# Patient Record
Sex: Male | Born: 2000 | Race: Black or African American | Hispanic: No | Marital: Single | Smoking: Never smoker
Health system: Southern US, Community
[De-identification: ages and names within clinical notes are randomized; demographics above are authoritative.]

---
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
## 2021-05-22 ENCOUNTER — Ambulatory Visit: Payer: 59 | Admitting: Dermatology

## 2021-05-22 DIAGNOSIS — L730 Acne keloid: Secondary | ICD-10-CM | POA: Diagnosis not present

## 2021-05-22 MED ORDER — CLINDAMYCIN PHOSPHATE 1 % EX SOLN: CUTANEOUS | 6 refills | Status: AC

## 2021-05-22 MED ORDER — CLOBETASOL PROPIONATE 0.05 % EX OINT: TOPICAL_OINTMENT | CUTANEOUS | 3 refills | Status: AC

## 2021-05-22 NOTE — Patient Instructions (Signed)

## 2021-05-22 NOTE — Progress Notes (Signed)
? ?  New Patient Visit ? ?Subjective  ?Angel Preston is a 21 y.o. male who presents for the following: Lesions (On the post scalp - have been present since high school, not itchy or painful, patient would like them checked today to make sure they are not harmful.). ? ?The following portions of the chart were reviewed this encounter and updated as appropriate:  ? Allergies  Meds  Problems  Med Hx  Surg Hx  Fam Hx   ?  ?Review of Systems:  No other skin or systemic complaints except as noted in HPI or Assessment and Plan. ? ?Objective  ?Well appearing patient in no apparent distress; mood and affect are within normal limits. ? ?A focused examination was performed including the scalp. Relevant physical exam findings are noted in the Assessment and Plan. ? ?Post scalp ? ? ? ? ? 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 Condition is symptomatic / bothersome to patient. Not to goal.  ? ?Start Clindamycin solution apply to aa's QHS.  ?Start Clobetasol ointment QHS 5d/wk.  ? ?Topical steroids (such as triamcinolone, fluocinolone, fluocinonide, mometasone, clobetasol, halobetasol, betamethasone, hydrocortisone) can cause thinning and lightening of the skin if they are used for too long in the same area. Your physician has selected the right strength medicine for your problem and area affected on the body. Please use your medication only as directed by your physician to prevent side effects.  ? ?clindamycin (CLEOCIN T) 1 % external solution - Post scalp ?Apply to aa's scalp QHS. ?clobetasol ointment (TEMOVATE) 0.05 % - Post scalp ?Apply to aa's scalp QHS 5d/wk. Avoid applying to face, groin, and axilla. Use as directed. Long-term use can cause thinning of the skin. ? ?Return in about 4 months (around 09/21/2021) for [facility] follow up . ? ?ICari Caraway, CMA, am acting as scribe for Armida Sans, MD . ?Documentation: I have  reviewed the above documentation for accuracy and completeness, and I agree with the above. ? ?Armida Sans, MD ? ? ?

## 2021-05-30 ENCOUNTER — Encounter: Payer: Self-pay | Admitting: Dermatology

## 2021-09-21 ENCOUNTER — Ambulatory Visit: Payer: 59 | Admitting: Dermatology

## 2021-12-15 ENCOUNTER — Encounter: Payer: Self-pay | Admitting: Internal Medicine

## 2021-12-15 ENCOUNTER — Ambulatory Visit: Payer: 59 | Admitting: Internal Medicine

## 2021-12-15 VITALS — BP 130/74 | HR 100 | Temp 98.4°F | Resp 18 | Ht 70.0 in | Wt 260.2 lb

## 2021-12-15 DIAGNOSIS — Z1159 Encounter for screening for other viral diseases: Secondary | ICD-10-CM

## 2021-12-15 DIAGNOSIS — Z130 Encounter for screening for diseases of the blood and blood-forming organs and certain disorders involving the immune mechanism: Secondary | ICD-10-CM | POA: Diagnosis not present

## 2021-12-15 DIAGNOSIS — Z114 Encounter for screening for human immunodeficiency virus [HIV]: Secondary | ICD-10-CM

## 2021-12-15 DIAGNOSIS — R7309 Other abnormal glucose: Secondary | ICD-10-CM

## 2021-12-15 DIAGNOSIS — Z0000 Encounter for general adult medical examination without abnormal findings: Secondary | ICD-10-CM | POA: Diagnosis not present

## 2021-12-15 DIAGNOSIS — Z872 Personal history of diseases of the skin and subcutaneous tissue: Secondary | ICD-10-CM

## 2021-12-15 DIAGNOSIS — Z13228 Encounter for screening for other metabolic disorders: Secondary | ICD-10-CM | POA: Diagnosis not present

## 2021-12-15 NOTE — Patient Instructions (Signed)
It was great seeing you today!  Plan discussed at today's visit: -Blood work ordered today, results will be uploaded to [facility].   Human Papillomavirus (HPV) Vaccine Injection What is this medication? HUMAN PAPILLOMAVIRUS VACCINE (HYOO muhn pap uh LOH muh vahy ruhs vak SEEN) reduces the risk of human papillomavirus (HPV). It does not treat HPV. It is still possible to get HPV after receiving this vaccine, but the symptoms may be less severe or not last as long. It works by helping your immune system learn how to fight off a future infection. This medicine may be used for other purposes; ask your health care provider or pharmacist if you have questions. COMMON BRAND NAME(S): Gardasil 9 What should I tell my care team before I take this medication? They need to know if you have any of these conditions: Fever Hemophilia HIV or AIDS Immune system problems Infection Low platelets An unusual reaction to human papillomavirus vaccine, yeast, other vaccines, other medications, foods, dyes, or preservatives Pregnant or trying to get pregnant Breastfeeding How should I use this medication? This vaccine is injected into a muscle. It is given by your care team. This vaccine requires 2 or 3 doses to get the full benefit. Set a reminder for when your next dose is due. A copy of the Vaccine Information Statement will be given before each vaccination. Be sure to read this information carefully each time. This sheet may change often. Talk to your care team about the use of this medication in children. While it may be prescribed for children as young as 9 years for selected conditions, precautions do apply. Overdosage: If you think you have taken too much of this medicine contact a poison control center or emergency room at once. NOTE: This medicine is only for you. Do not share this medicine with others. What if I miss a dose? Keep appointments for follow-up doses as directed. It is important not to miss  your dose. Call your care team if you are unable to keep an appointment. What may interact with this medication? Certain medications for arthritis Medications for organ transplant Medications to treat cancer Steroid medications, such as prednisone or cortisone This list may not describe all possible interactions. Give your health care provider a list of all the medicines, herbs, non-prescription drugs, or dietary supplements you use. Also tell them if you smoke, drink alcohol, or use illegal drugs. Some items may interact with your medicine. What should I watch for while using this medication? Visit your care team regularly. Report any side effects to your care team right away. This vaccine, like all vaccines, may not fully protect everyone. What side effects may I notice from receiving this medication? Side effects that you should report to your care team as soon as possible: Allergic reactions--skin rash, itching, hives, swelling of the face, lips, tongue, or throat Feeling faint or lightheaded Side effects that usually do not require medical attention (report these to your care team if they continue or are bothersome): Diarrhea Dizziness Fatigue Fever Headache Nausea Pain, redness, irritation, or bruising at the injection site This list may not describe all possible side effects. Call your doctor for medical advice about side effects. You may report side effects to FDA at 1-800-FDA-1088. Where should I keep my medication? This vaccine is only given by your care team. It will not be stored at home. NOTE: This sheet is a summary. It may not cover all possible information. If you have questions about this medicine, talk to  your doctor, pharmacist, or health care provider.  2023 Elsevier/Gold Standard (2021-06-27 00:00:00)  Follow up in: 1 year   Take care and let us know if you have any questions or concerns prior to your next visit.  Dr. Rosana Berger

## 2021-12-15 NOTE — Progress Notes (Signed)
New Patient Office Visit  Subjective    Patient ID: Angel Preston, male    DOB: 02-07-2000  Age: 21 y.o. MRN: [id number]  CC:  Chief Complaint  Patient presents with   Establish Care   Nail Problem    Infection back in sept, nail fell off, growing back weird    HPI Angel Preston presents to establish care.  He with his father.  The patient has no chronic medical conditions and does not take any medications daily.  He has followed with dermatology and is currently being treated with clindamycin cream ankle betazole ointment as needed for acne.  The patient was recently seen for paronychia of the fourth digit on his left hand on 10/29/2021.  He was treated with doxycycline and the nail was removed.  The patient states the nail has been slowly growing back but he is worried it is not growing back normally.  He denies any redness, pain or drainage associated with the nail or finger.  Otherwise he is doing well and has no other questions or concerns today.  Health maintenance: -Blood work due -Flu vaccine and HPV vaccine series due, discussed with patient and his father who politely declines today.  Literature provided.      Outpatient Encounter Medications as of 12/15/2021  Medication Sig   clindamycin (CLEOCIN T) 1 % external solution Apply to aa's scalp QHS.   clobetasol ointment (TEMOVATE) 0.05 % Apply to aa's scalp QHS 5d/wk. Avoid applying to face, groin, and axilla. Use as directed. Long-term use can cause thinning of the skin.   No facility-administered encounter medications on file as of 12/15/2021.    History reviewed. No pertinent past medical history.  History reviewed. No pertinent surgical history.  Family History  Problem Relation Age of Onset   Hypertension Mother    Diabetes Mother    Hypertension Father     Social History   Socioeconomic History   Marital status: Single    Spouse name: Not on file   Number of children: Not on file   Years of education: Not on  file   Highest education level: Not on file  Occupational History   Not on file  Tobacco Use   Smoking status: Never   Smokeless tobacco: Never  Vaping Use   Vaping Use: Never used  Substance and Sexual Activity   Alcohol use: Never   Drug use: Not Currently   Sexual activity: Never  Other Topics Concern   Not on file  Social History Narrative   Not on file   Social Determinants of Health   Financial Resource Strain: Not on file  Food Insecurity: Not on file  Transportation Needs: Not on file  Physical Activity: Not on file  Stress: Not on file  Social Connections: Not on file  Intimate Partner Violence: Not on file    Review of Systems  Constitutional:  Negative for chills and fever.  Gastrointestinal:  Negative for abdominal pain, diarrhea, nausea and vomiting.  Genitourinary:  Negative for dysuria and frequency.      Objective    BP 130/74   Pulse 100   Temp 98.4 F (36.9 C)   Resp 18   Ht 5\' 10"  (1.778 m)   Wt 260 lb 3.2 oz (118 kg)   SpO2 98%   BMI 37.33 kg/m   Physical Exam Constitutional:      Appearance: Normal appearance.  HENT:     Head: Normocephalic and atraumatic.     Mouth/Throat:  Mouth: Mucous membranes are moist.     Pharynx: Oropharynx is clear.  Eyes:     Extraocular Movements: Extraocular movements intact.     Conjunctiva/sclera: Conjunctivae normal.     Pupils: Pupils are equal, round, and reactive to light.  Cardiovascular:     Rate and Rhythm: Normal rate and regular rhythm.  Pulmonary:     Effort: Pulmonary effort is normal.     Breath sounds: Normal breath sounds.  Musculoskeletal:     Right lower leg: No edema.     Left lower leg: No edema.  Lymphadenopathy:     Cervical: No cervical adenopathy.  Skin:    General: Skin is warm and dry.     Comments: Fourth left digit nail slowly growing back with slight curved appearance, surrounding skin without signs of infection.  Neurological:     General: No focal deficit  present.     Mental Status: He is alert. Mental status is at baseline.  Psychiatric:        Mood and Affect: Mood normal.        Behavior: Behavior normal.         Assessment & Plan:   1. Annual physical exam/ Screening for metabolic disorder/Screening for deficiency anemia/Encounter for hepatitis C screening test for low risk patient/Screening for HIV without presence of risk factors: Screening labs due.  Follow-up in 1 year or sooner as needed.  - HIV antibody (with reflex) - Hepatitis C Antibody - CBC w/Diff/Platelet - COMPLETE METABOLIC PANEL WITH GFR  2. History of paronychia of finger: No current infection, nail slowly growing back.  Return in about 1 year (around 12/16/2022).   Margarita Mail, DO

## 2021-12-18 NOTE — Addendum Note (Signed)
Addended by: Margarita Mail on: 12/18/2021 09:37 PM   Modules accepted: Orders

## 2021-12-20 LAB — COMPLETE METABOLIC PANEL WITH GFR
AG Ratio: 2 (calc) (ref 1.0–2.5)
ALT: 44 U/L (ref 9–46)
AST: 20 U/L (ref 10–40)
Albumin: 4.6 g/dL (ref 3.6–5.1)
Alkaline phosphatase (APISO): 61 U/L (ref 36–130)
BUN: 8 mg/dL (ref 7–25)
CO2: 30 mmol/L (ref 20–32)
Calcium: 9.9 mg/dL (ref 8.6–10.3)
Chloride: 102 mmol/L (ref 98–110)
Creat: 0.97 mg/dL (ref 0.60–1.24)
Globulin: 2.3 g/dL (calc) (ref 1.9–3.7)
Glucose, Bld: 136 mg/dL — ABNORMAL HIGH (ref 65–99)
Potassium: 4.3 mmol/L (ref 3.5–5.3)
Sodium: 140 mmol/L (ref 135–146)
Total Bilirubin: 0.7 mg/dL (ref 0.2–1.2)
Total Protein: 6.9 g/dL (ref 6.1–8.1)
eGFR: 115 mL/min/{1.73_m2} (ref >=60)

## 2021-12-20 LAB — CBC WITH DIFFERENTIAL/PLATELET
Absolute Monocytes: 578 cells/uL (ref 200–950)
Basophils Absolute: 49 cells/uL (ref 0–200)
Basophils Relative: 0.5 %
Eosinophils Absolute: 98 cells/uL (ref 15–500)
Eosinophils Relative: 1 %
HCT: 46.2 % (ref 38.5–50.0)
Hemoglobin: 14.8 g/dL (ref 13.2–17.1)
Lymphs Abs: 2421 cells/uL (ref 850–3900)
MCH: 27.1 pg (ref 27.0–33.0)
MCHC: 32 g/dL (ref 32.0–36.0)
MCV: 84.5 fL (ref 80.0–100.0)
MPV: 11 fL (ref 7.5–12.5)
Monocytes Relative: 5.9 %
Neutro Abs: 6654 cells/uL (ref 1500–7800)
Neutrophils Relative %: 67.9 %
Platelets: 304 10*3/uL (ref 140–400)
RBC: 5.47 10*6/uL (ref 4.20–5.80)
RDW: 12.3 % (ref 11.0–15.0)
Total Lymphocyte: 24.7 %
WBC: 9.8 10*3/uL (ref 3.8–10.8)

## 2021-12-20 LAB — TEST AUTHORIZATION

## 2021-12-20 LAB — HEMOGLOBIN A1C
Hgb A1c MFr Bld: 5.8 % of total Hgb — ABNORMAL HIGH (ref <5.7)
Mean Plasma Glucose: 120 mg/dL
eAG (mmol/L): 6.6 mmol/L

## 2021-12-20 LAB — HIV ANTIBODY (ROUTINE TESTING W REFLEX): HIV 1&2 Ab, 4th Generation: NONREACTIVE

## 2021-12-20 LAB — HEPATITIS C ANTIBODY: Hepatitis C Ab: NONREACTIVE

## 2022-10-20 LAB — HEMOGLOBIN A1C: A1c: 5.4

## 2022-12-17 ENCOUNTER — Ambulatory Visit (INDEPENDENT_AMBULATORY_CARE_PROVIDER_SITE_OTHER): Payer: 59 | Admitting: Internal Medicine

## 2022-12-17 ENCOUNTER — Encounter: Payer: Self-pay | Admitting: Internal Medicine

## 2022-12-17 VITALS — BP 134/86 | HR 70 | Temp 98.0°F | Resp 18 | Ht 70.0 in | Wt 258.0 lb

## 2022-12-17 DIAGNOSIS — Z23 Encounter for immunization: Secondary | ICD-10-CM | POA: Diagnosis not present

## 2022-12-17 DIAGNOSIS — Z0000 Encounter for general adult medical examination without abnormal findings: Secondary | ICD-10-CM

## 2022-12-17 NOTE — Addendum Note (Signed)
Addended by: Davene Costain on: 12/17/2022 09:57 AM   Modules accepted: Orders

## 2022-12-17 NOTE — Patient Instructions (Signed)
It was great seeing you today!  Plan discussed at today's visit: -Blood work ordered today, results will be uploaded to MyChart.  -Flu vaccine and first HPV vaccine given today, will have you return in 1 month for second HPV vaccine and Tdap vaccine  Follow up in: 1 month nurse's visit and 1 year for CPE  Take care and let us know if you have any questions or concerns prior to your next visit.  Dr. Caralee Ates

## 2022-12-17 NOTE — Progress Notes (Signed)
Name: Angel Preston   MRN: [id number]    DOB: 06-Mar-2000   Date:12/17/2022       Progress Note  Subjective  Chief Complaint  Chief Complaint  Patient presents with   Annual Exam    HPI  Patient presents for annual CPE. He is here with his dad today.  Diet: chicken, rice, seafood. Well rounded, doesn't like crab and pork. Eats fruits and vegetables.  Exercise: None, discussed importance of increasing cardiovascular exercise to 150 minutes a week Last Dental Exam: UTD Last Eye Exam: Doesn't have an eye doctor but no issues  Depression: phq 9 is negative    12/17/2022    8:58 AM 12/15/2021   10:32 AM  Depression screen PHQ 2/9  Decreased Interest 0 0  Down, Depressed, Hopeless 0 0  PHQ - 2 Score 0 0  Altered sleeping 0 0  Tired, decreased energy 0 0  Change in appetite 0 0  Feeling bad or failure about yourself  0 0  Trouble concentrating 0 0  Moving slowly or fidgety/restless 0 0  Suicidal thoughts 0 0  PHQ-9 Score 0 0  Difficult doing work/chores Not difficult at all Not difficult at all    Hypertension:  BP Readings from Last 3 Encounters:  12/17/22 134/86  12/15/21 130/74    Obesity: Wt Readings from Last 3 Encounters:  12/17/22 258 lb (117 kg)  12/15/21 260 lb 3.2 oz (118 kg)   BMI Readings from Last 3 Encounters:  12/17/22 37.02 kg/m  12/15/21 37.33 kg/m     Lipids:  No results found for: "CHOL" No results found for: "HDL" No results found for: "LDLCALC" No results found for: "TRIG" No results found for: "CHOLHDL" No results found for: "LDLDIRECT" Glucose:  Glucose, Bld  Date Value Ref Range Status  12/15/2021 136 (H) 65 - 99 mg/dL Final    Comment:    .            Fasting reference interval . For someone without known diabetes, a glucose value >125 mg/dL indicates that they may have diabetes and this should be confirmed with a follow-up test. .       Single STD testing and prevention (HIV/chl/gon/syphilis): HIV complete - no  concerns about STD's Hep C Screening: complete Skin cancer: Discussed monitoring for atypical lesions Colorectal cancer: N/a Prostate cancer:  not applicable No results found for: "PSA"   Lung cancer:  Low Dose CT Chest recommended if Age 21-80 years, 30 pack-year currently smoking OR have quit w/in 21years. Patient  not applicable a candidate for screening   AAA: The USPSTF recommends one-time screening with ultrasonography in men ages 21 to 75 years who have ever smoked. Patient   not applicable, a candidate for screening   Vaccines:   HPV: due Tdap: due Shingrix: N/a Pneumonia: N/a Flu: due COVID-19:3 doses  There are no problems to display for this patient.   History reviewed. No pertinent surgical history.  Family History  Problem Relation Age of Onset   Hypertension Mother    Diabetes Mother    Hypertension Father     Social History   Socioeconomic History   Marital status: Single    Spouse name: Not on file   Number of children: Not on file   Years of education: Not on file   Highest education level: Not on file  Occupational History   Not on file  Tobacco Use   Smoking status: Never   Smokeless tobacco: Never  Vaping Use  Vaping status: Never Used  Substance and Sexual Activity   Alcohol use: Never   Drug use: Not Currently   Sexual activity: Never  Other Topics Concern   Not on file  Social History Narrative   Not on file   Social Determinants of Health   Financial Resource Strain: Not on file  Food Insecurity: Not on file  Transportation Needs: Not on file  Physical Activity: Not on file  Stress: Not on file  Social Connections: Not on file  Intimate Partner Violence: Not on file     Current Outpatient Medications:    clindamycin (CLEOCIN T) 1 % external solution, Apply to aa's scalp QHS., Disp: 30 mL, Rfl: 6   clobetasol ointment (TEMOVATE) 0.05 %, Apply to aa's scalp QHS 5d/wk. Avoid applying to face, groin, and axilla. Use as  directed. Long-term use can cause thinning of the skin., Disp: 30 g, Rfl: 3  No Known Allergies   Review of Systems  All other systems reviewed and are negative.    Objective  Vitals:   12/17/22 0856  BP: 134/86  Pulse: 70  Resp: 18  Temp: 98 F (36.7 C)  SpO2: 98%  Weight: 258 lb (117 kg)  Height: 5\' 10"  (1.778 m)    Body mass index is 37.02 kg/m.  Physical Exam Constitutional:      Appearance: Normal appearance.  HENT:     Head: Normocephalic and atraumatic.     Mouth/Throat:     Mouth: Mucous membranes are moist.     Pharynx: Oropharynx is clear.  Eyes:     Extraocular Movements: Extraocular movements intact.     Conjunctiva/sclera: Conjunctivae normal.     Pupils: Pupils are equal, round, and reactive to light.  Neck:     Comments: No thyromegaly  Cardiovascular:     Rate and Rhythm: Normal rate and regular rhythm.  Pulmonary:     Effort: Pulmonary effort is normal.     Breath sounds: Normal breath sounds.  Musculoskeletal:     Cervical back: No tenderness.     Right lower leg: No edema.     Left lower leg: No edema.  Lymphadenopathy:     Cervical: No cervical adenopathy.  Skin:    General: Skin is warm and dry.  Neurological:     General: No focal deficit present.     Mental Status: He is alert. Mental status is at baseline.  Psychiatric:        Mood and Affect: Mood normal.        Behavior: Behavior normal.     Recent Results (from the past 2160 hour(s))  Hemoglobin A1c     Status: None   Collection Time: 10/20/22  9:07 AM  Result Value Ref Range   A1c 5.4     Comment: Abstracted by HIM     Fall Risk:    12/17/2022    8:56 AM 12/15/2021   10:32 AM  Fall Risk   Falls in the past year? 0 0  Number falls in past yr: 0 0  Injury with Fall? 0 0     Functional Status Survey: Is the patient deaf or have difficulty hearing?: No Does the patient have difficulty seeing, even when wearing glasses/contacts?: No Does the patient have  difficulty concentrating, remembering, or making decisions?: No Does the patient have difficulty walking or climbing stairs?: No Does the patient have difficulty dressing or bathing?: No Does the patient have difficulty doing errands alone such as visiting a  doctor's office or shopping?: No    Assessment & Plan  1. Annual physical exam: Physical exam completed, health maintenance reviewed and annual labs ordered.   - CBC w/Diff/Platelet - COMPLETE METABOLIC PANEL WITH GFR - Lipid Profile  2. Flu vaccine need: Flu vaccine administered today.   3. Need for HPV vaccine: First HPV vaccine given today.    -Prostate cancer screening and PSA options (with potential risks and benefits of testing vs not testing) were discussed along with recent recs/guidelines. -USPSTF grade A and B recommendations reviewed with patient; age-appropriate recommendations, preventive care, screening tests, etc discussed and encouraged; healthy living encouraged; see AVS for patient education given to patient -Discussed importance of 150 minutes of physical activity weekly, eat two servings of fish weekly, eat one serving of tree nuts ( cashews, pistachios, pecans, almonds.Marland Kitchen) every other day, eat 6 servings of fruit/vegetables daily and drink plenty of water and avoid sweet beverages.  -Reviewed Health Maintenance: yes

## 2022-12-18 LAB — CBC WITH DIFFERENTIAL/PLATELET
Absolute Lymphocytes: 2423 {cells}/uL (ref 850–3900)
Absolute Monocytes: 782 {cells}/uL (ref 200–950)
Basophils Absolute: 60 {cells}/uL (ref 0–200)
Basophils Relative: 0.7 %
Eosinophils Absolute: 136 {cells}/uL (ref 15–500)
Eosinophils Relative: 1.6 %
HCT: 44.9 % (ref 38.5–50.0)
Hemoglobin: 14.1 g/dL (ref 13.2–17.1)
MCH: 26.4 pg — ABNORMAL LOW (ref 27.0–33.0)
MCHC: 31.4 g/dL — ABNORMAL LOW (ref 32.0–36.0)
MCV: 84.1 fL (ref 80.0–100.0)
MPV: 11.4 fL (ref 7.5–12.5)
Monocytes Relative: 9.2 %
Neutro Abs: 5100 {cells}/uL (ref 1500–7800)
Neutrophils Relative %: 60 %
Platelets: 280 10*3/uL (ref 140–400)
RBC: 5.34 10*6/uL (ref 4.20–5.80)
RDW: 12.7 % (ref 11.0–15.0)
Total Lymphocyte: 28.5 %
WBC: 8.5 10*3/uL (ref 3.8–10.8)

## 2022-12-18 LAB — COMPLETE METABOLIC PANEL WITH GFR
AG Ratio: 2 (calc) (ref 1.0–2.5)
ALT: 45 U/L (ref 9–46)
AST: 19 U/L (ref 10–40)
Albumin: 4.6 g/dL (ref 3.6–5.1)
Alkaline phosphatase (APISO): 67 U/L (ref 36–130)
BUN: 8 mg/dL (ref 7–25)
CO2: 31 mmol/L (ref 20–32)
Calcium: 9.4 mg/dL (ref 8.6–10.3)
Chloride: 104 mmol/L (ref 98–110)
Creat: 1 mg/dL (ref 0.60–1.24)
Globulin: 2.3 g/dL (ref 1.9–3.7)
Glucose, Bld: 105 mg/dL — ABNORMAL HIGH (ref 65–99)
Potassium: 4.7 mmol/L (ref 3.5–5.3)
Sodium: 140 mmol/L (ref 135–146)
Total Bilirubin: 0.7 mg/dL (ref 0.2–1.2)
Total Protein: 6.9 g/dL (ref 6.1–8.1)
eGFR: 110 mL/min/{1.73_m2} (ref >=60)

## 2022-12-18 LAB — LIPID PANEL
Cholesterol: 157 mg/dL (ref <200)
HDL: 51 mg/dL (ref >=40)
LDL Cholesterol (Calc): 88 mg/dL
Non-HDL Cholesterol (Calc): 106 mg/dL (ref <130)
Total CHOL/HDL Ratio: 3.1 (calc) (ref <5.0)
Triglycerides: 85 mg/dL (ref <150)

## 2023-01-18 ENCOUNTER — Ambulatory Visit (INDEPENDENT_AMBULATORY_CARE_PROVIDER_SITE_OTHER): Payer: 59

## 2023-01-18 DIAGNOSIS — Z23 Encounter for immunization: Secondary | ICD-10-CM

## 2023-01-18 NOTE — Progress Notes (Unsigned)
 Patient is in office today for a nurse visit for Immunization. Patient Injection was given in the  Right deltoid. Patient tolerated injection well.

## 2023-12-18 ENCOUNTER — Encounter: Payer: Self-pay | Admitting: Internal Medicine

## 2023-12-18 ENCOUNTER — Other Ambulatory Visit: Payer: Self-pay

## 2023-12-18 ENCOUNTER — Ambulatory Visit: Payer: 59 | Admitting: Internal Medicine

## 2023-12-18 VITALS — BP 120/72 | HR 64 | Temp 98.1°F | Resp 18 | Ht 70.0 in | Wt 260.7 lb

## 2023-12-18 DIAGNOSIS — Z23 Encounter for immunization: Secondary | ICD-10-CM | POA: Diagnosis not present

## 2023-12-18 DIAGNOSIS — L989 Disorder of the skin and subcutaneous tissue, unspecified: Secondary | ICD-10-CM

## 2023-12-18 DIAGNOSIS — Z0001 Encounter for general adult medical examination with abnormal findings: Secondary | ICD-10-CM | POA: Diagnosis not present

## 2023-12-18 DIAGNOSIS — Z0000 Encounter for general adult medical examination without abnormal findings: Secondary | ICD-10-CM

## 2023-12-18 NOTE — Progress Notes (Signed)
 Name: Angel Preston   MRN: [id number]    DOB: 05/24/2000   Date:12/18/2023       Progress Note  Subjective  Chief Complaint  Chief Complaint  Patient presents with   Annual Exam    HPI  Patient presents for annual CPE .  Discussed the use of AI scribe software for clinical note transcription with the patient, who gave verbal consent to proceed.  History of Present Illness Angel Preston is a 23 year old male who presents for an annual physical exam.  He uses clindamycin  and clobetasol  cream for persistent scalp lesions that sometimes bleed. These lesions have been removed before but recur, and current treatments are ineffective. He has not visited a dermatologist in over a year.  His lifestyle includes frequent dining out, making a healthy diet challenging. He runs outside about once a week and often snacks late at night, usually on popcorn.  His family history includes his father having prostate cancer at 83 and both parents having diabetes. His fasting blood sugar last year was between 108-105 mg/dL.  He has received all recommended vaccines, including the flu and meningococcal vaccines. He has not had an eye exam this year but did have a dental exam. He reports no vision changes or concerns about sexually transmitted diseases.   Diet: Regular - well balanced, eats out a lot  Exercise: 1 day 30 minutes - running  Last Dental Exam: completed Last Eye Exam: will schedule  Depression: phq 9 is negative    12/18/2023    8:54 AM 12/17/2022    8:58 AM 12/15/2021   10:32 AM  Depression screen PHQ 2/9  Decreased Interest 0 0 0  Down, Depressed, Hopeless 0 0 0  PHQ - 2 Score 0 0 0  Altered sleeping  0 0  Tired, decreased energy  0 0  Change in appetite  0 0  Feeling bad or failure about yourself   0 0  Trouble concentrating  0 0  Moving slowly or fidgety/restless  0 0  Suicidal thoughts  0 0  PHQ-9 Score  0  0   Difficult doing work/chores  Not difficult at all Not difficult at  all     Data saved with a previous flowsheet row definition    Hypertension:  BP Readings from Last 3 Encounters:  12/18/23 120/72  12/17/22 134/86  12/15/21 130/74    Obesity: Wt Readings from Last 3 Encounters:  12/18/23 260 lb 11.2 oz (118.3 kg)  12/17/22 258 lb (117 kg)  12/15/21 260 lb 3.2 oz (118 kg)   BMI Readings from Last 3 Encounters:  12/18/23 37.41 kg/m  12/17/22 37.02 kg/m  12/15/21 37.33 kg/m     Flowsheet Row Office Visit from 12/18/20[facility] in [facility]  AUDIT-C Score 0     Single STD testing and prevention (HIV/chl/gon/syphilis):  no Hep C Screening: completed Skin cancer: Discussed monitoring for atypical lesions Colorectal cancer: NA Prostate cancer:  not applicable No results found for: PSA   Lung cancer:  Low Dose CT Chest recommended if Age 62-80 years, 30 pack-year currently smoking OR have quit w/in 15years. Patient  is not a candidate for screening   AAA: The USPSTF recommends one-time screening with ultrasonography in men ages 103 to 75 years who have ever smoked. Patient   is not a candidate for screening  ECG:  NA  Vaccines: reviewed with the patient. Flu and HPV vaccines given today.   Advanced Care Planning: A voluntary  discussion about advance care planning including the explanation and discussion of advance directives.  Discussed health care proxy and Living will, and the patient was able to identify a health care proxy as [name] (mother).  Patient does not have a living will and power of attorney of health care   There are no active problems to display for this patient.   No past surgical history on file.  Family History  Problem Relation Age of Onset   Hypertension Mother    Diabetes Mother    Hypertension Father     Social History   Socioeconomic History   Marital status: Single    Spouse name: Not on file   Number of children: Not on file   Years of education: Not on file   Highest  education level: Not on file  Occupational History   Not on file  Tobacco Use   Smoking status: Never   Smokeless tobacco: Never  Vaping Use   Vaping status: Never Used  Substance and Sexual Activity   Alcohol use: Never   Drug use: Not Currently   Sexual activity: Never  Other Topics Concern   Not on file  Social History Narrative   Not on file   Social Drivers of Health   Financial Resource Strain: Low Risk  (12/18/2023)   Overall Financial Resource Strain (CARDIA)    Difficulty of Paying Living Expenses: Not hard at all  Food Insecurity: No Food Insecurity (12/18/2023)   Hunger Vital Sign    Worried About Running Out of Food in the Last Year: Never true    Ran Out of Food in the Last Year: Never true  Transportation Needs: No Transportation Needs (12/18/2023)   PRAPARE - Administrator, Civil Service (Medical): No    Lack of Transportation (Non-Medical): No  Physical Activity: Insufficiently Active (12/18/2023)   Exercise Vital Sign    Days of Exercise per Week: 1 day    Minutes of Exercise per Session: 20 min  Stress: No Stress Concern Present (12/18/2023)   [employer] of Occupational Health - Occupational Stress Questionnaire    Feeling of Stress: Not at all  Social Connections: Moderately Isolated (12/18/2023)   Social Connection and Isolation Panel    Frequency of Communication with Friends and Family: More than three times a week    Frequency of Social Gatherings with Friends and Family: More than three times a week    Attends Religious Services: More than 4 times per year    Active Member of [employer] or Organizations: No    Attends Banker Meetings: Never    Marital Status: Never married  Intimate Partner Violence: Not At Risk (12/18/2023)   Humiliation, Afraid, Rape, and Kick questionnaire    Fear of Current or Ex-Partner: No    Emotionally Abused: No    Physically Abused: No    Sexually Abused: No     Current Outpatient  Medications:    clindamycin  (CLEOCIN  T) 1 % external solution, Apply to aa's scalp QHS., Disp: 30 mL, Rfl: 6   clobetasol  ointment (TEMOVATE ) 0.05 %, Apply to aa's scalp QHS 5d/wk. Avoid applying to face, groin, and axilla. Use as directed. Long-term use can cause thinning of the skin., Disp: 30 g, Rfl: 3  No Known Allergies   Review of Systems  All other systems reviewed and are negative.    Objective  Vitals:   12/18/23 0846  BP: 120/72  Pulse: 64  Resp: 18  Temp:  98.1 F (36.7 C)  TempSrc: Oral  SpO2: 98%  Weight: 260 lb 11.2 oz (118.3 kg)  Height: 5' 10 (1.778 m)    Body mass index is 37.41 kg/m.  Physical Exam Constitutional:      Appearance: Normal appearance.  HENT:     Head: Normocephalic and atraumatic.     Mouth/Throat:     Mouth: Mucous membranes are moist.     Pharynx: Oropharynx is clear.  Eyes:     Extraocular Movements: Extraocular movements intact.     Conjunctiva/sclera: Conjunctivae normal.     Pupils: Pupils are equal, round, and reactive to light.  Neck:     Comments: No thyromegaly Cardiovascular:     Rate and Rhythm: Normal rate and regular rhythm.  Pulmonary:     Effort: Pulmonary effort is normal.     Breath sounds: Normal breath sounds.  Musculoskeletal:     Cervical back: No tenderness.     Right lower leg: No edema.     Left lower leg: No edema.  Lymphadenopathy:     Cervical: No cervical adenopathy.  Skin:    General: Skin is warm and dry.     Comments: Scalp lesions present, see picture below  Neurological:     General: No focal deficit present.     Mental Status: He is alert. Mental status is at baseline.  Psychiatric:        Mood and Affect: Mood normal.        Behavior: Behavior normal.     Last CBC Lab Results  Component Value Date   WBC 8.5 12/17/2022   HGB 14.1 12/17/2022   HCT 44.9 12/17/2022   MCV 84.1 12/17/2022   MCH 26.4 (L) 12/17/2022   RDW 12.7 12/17/2022   PLT 280 12/17/2022   Last metabolic  panel Lab Results  Component Value Date   GLUCOSE 105 (H) 12/17/2022   NA 140 12/17/2022   K 4.7 12/17/2022   CL 104 12/17/2022   CO2 31 12/17/2022   BUN 8 12/17/2022   CREATININE 1.00 12/17/2022   EGFR 110 12/17/2022   CALCIUM 9.4 12/17/2022   PROT 6.9 12/17/2022   BILITOT 0.7 12/17/2022   AST 19 12/17/2022   ALT 45 12/17/2022   Last lipids Lab Results  Component Value Date   CHOL 157 12/17/2022   HDL 51 12/17/2022   LDLCALC 88 12/17/2022   TRIG 85 12/17/2022   CHOLHDL 3.1 12/17/2022   Last hemoglobin A1c Lab Results  Component Value Date   HGBA1C 5.8 (H) 12/15/2021   Last thyroid functions No results found for: TSH, T3TOTAL, T4TOTAL, FREET4, THYROIDAB Last vitamin D No results found for: 25OHVITD2, 25OHVITD3, VD25OH Last vitamin B12 and Folate No results found for: VITAMINB12, FOLATE    Assessment & Plan  Assessment & Plan Adult Wellness Visit Routine wellness visit with no acute concerns. Family history of prostate cancer. Previous cholesterol levels were good. Fasting blood glucose slightly elevated last year. - Ordered yearly blood work including cholesterol and fasting blood glucose. - Encouraged balanced diet with more home-cooked meals and less restaurant dining. - Advised increasing exercise frequency to three times a week. - Provided printed nutrition information for review at home. - Scheduled optometrist appointment for vision screening.  Scalp lesions Chronic scalp lesions with bleeding. Previous treatments ineffective. Lesions may require removal and further evaluation for potential genetic component. - Referred to [facility] in [place] for evaluation of scalp lesions. - Uploaded clinical photo of scalp lesions to  chart for dermatologist review.  Elevated fasting blood glucose Fasting blood glucose slightly elevated last year, close to prediabetic range. Family history of diabetes. - Ordered fasting blood glucose as  part of yearly blood work. - Advised monitoring diet and lifestyle to prevent progression to diabetes. - Encouraged regular exercise and mindful eating habits.  - Flu vaccine trivalent PF, 6mos and older(Flulaval,Afluria,Fluarix,Fluzone) - HPV 9-valent vaccine,Recombinat - Ambulatory referral to Dermatology - CBC w/Diff/Platelet - Comprehensive Metabolic Panel (CMET) - Lipid Profile  -Prostate cancer screening and PSA options (with potential risks and benefits of testing vs not testing) were discussed along with recent recs/guidelines. -USPSTF grade A and B recommendations reviewed with patient; age-appropriate recommendations, preventive care, screening tests, etc discussed and encouraged; healthy living encouraged; see AVS for patient education given to patient -Discussed importance of 150 minutes of physical activity weekly, eat two servings of fish weekly, eat one serving of tree nuts ( cashews, pistachios, pecans, almonds.SABRA) every other day, eat 6 servings of fruit/vegetables daily and drink plenty of water and avoid sweet beverages.  -Reviewed Health Maintenance: yes

## 2023-12-19 ENCOUNTER — Ambulatory Visit: Payer: Self-pay | Admitting: Internal Medicine

## 2023-12-19 LAB — CBC WITH DIFFERENTIAL/PLATELET
Absolute Lymphocytes: 2390 {cells}/uL (ref 850–3900)
Absolute Monocytes: 752 {cells}/uL (ref 200–950)
Basophils Absolute: 62 {cells}/uL (ref 0–200)
Basophils Relative: 0.6 %
Eosinophils Absolute: 216 {cells}/uL (ref 15–500)
Eosinophils Relative: 2.1 %
HCT: 45.5 % (ref 38.5–50.0)
Hemoglobin: 14.2 g/dL (ref 13.2–17.1)
MCH: 26.2 pg — ABNORMAL LOW (ref 27.0–33.0)
MCHC: 31.2 g/dL — ABNORMAL LOW (ref 32.0–36.0)
MCV: 83.9 fL (ref 80.0–100.0)
MPV: 11.3 fL (ref 7.5–12.5)
Monocytes Relative: 7.3 %
Neutro Abs: 6880 {cells}/uL (ref 1500–7800)
Neutrophils Relative %: 66.8 %
Platelets: 287 Thousand/uL (ref 140–400)
RBC: 5.42 Million/uL (ref 4.20–5.80)
RDW: 12.9 % (ref 11.0–15.0)
Total Lymphocyte: 23.2 %
WBC: 10.3 Thousand/uL (ref 3.8–10.8)

## 2023-12-19 LAB — COMPREHENSIVE METABOLIC PANEL WITH GFR
AG Ratio: 2.1 (calc) (ref 1.0–2.5)
ALT: 45 U/L (ref 9–46)
AST: 17 U/L (ref 10–40)
Albumin: 4.6 g/dL (ref 3.6–5.1)
Alkaline phosphatase (APISO): 62 U/L (ref 36–130)
BUN: 11 mg/dL (ref 7–25)
CO2: 30 mmol/L (ref 20–32)
Calcium: 9.7 mg/dL (ref 8.6–10.3)
Chloride: 103 mmol/L (ref 98–110)
Creat: 0.92 mg/dL (ref 0.60–1.24)
Globulin: 2.2 g/dL (ref 1.9–3.7)
Glucose, Bld: 93 mg/dL (ref 65–99)
Potassium: 4.4 mmol/L (ref 3.5–5.3)
Sodium: 141 mmol/L (ref 135–146)
Total Bilirubin: 0.4 mg/dL (ref 0.2–1.2)
Total Protein: 6.8 g/dL (ref 6.1–8.1)
eGFR: 121 mL/min/1.73m2 (ref >=60)

## 2023-12-19 LAB — LIPID PANEL
Cholesterol: 173 mg/dL (ref <200)
HDL: 57 mg/dL (ref >=40)
LDL Cholesterol (Calc): 94 mg/dL
Non-HDL Cholesterol (Calc): 116 mg/dL (ref <130)
Total CHOL/HDL Ratio: 3 (calc) (ref <5.0)
Triglycerides: 127 mg/dL (ref <150)

## 2024-08-11 ENCOUNTER — Ambulatory Visit: Admitting: Physician Assistant

## 2024-12-18 ENCOUNTER — Encounter: Admitting: Internal Medicine
